# Patient Record
Sex: Male | Born: 2000 | Race: Black or African American | Hispanic: No | Marital: Single | Smoking: Never smoker
Health system: Southern US, Community
[De-identification: ages and names within clinical notes are randomized; demographics above are authoritative.]

---
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
## 2022-11-12 IMAGING — CT CT ANGIO HEAD
2 of 11 series · 8 of 47 positions shown · IV contrast (Omni 300)
Comparison: None.

CLINICAL DATA: Neuro deficit, acute, stroke suspected. Right eye
ptosis.

EXAM:
CT ANGIOGRAPHY HEAD
TECHNIQUE: Multidetector CT imaging of the head was performed using the
standard protocol during bolus administration of intravenous
contrast. Multiplanar CT image reconstructions and MIPs were
obtained to evaluate the vascular anatomy.
CONTRAST:  75mL OMNIPAQUE IOHEXOL 350 MG/ML SOLN

[Series 9: head bone · axial · 0.49mm/px · z∈[-94,+10]mm · 4 of 88 slices shown]
[im 18/88  bone]
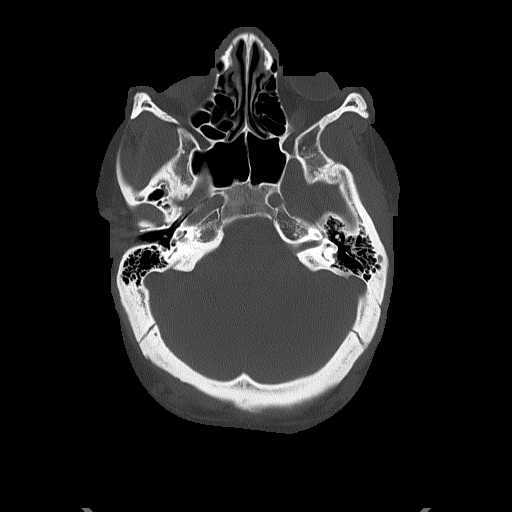
[im 35/88  bone]
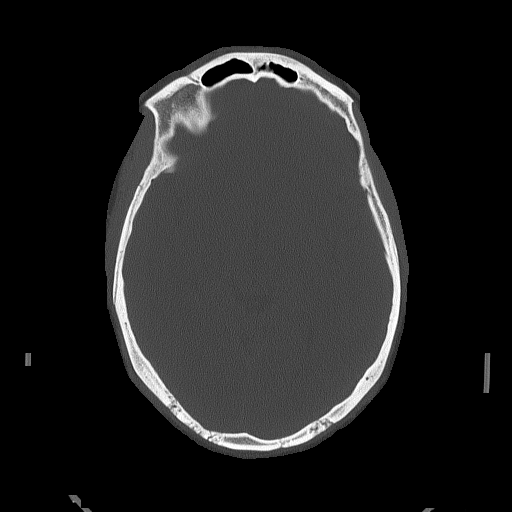
[im 53/88  bone]
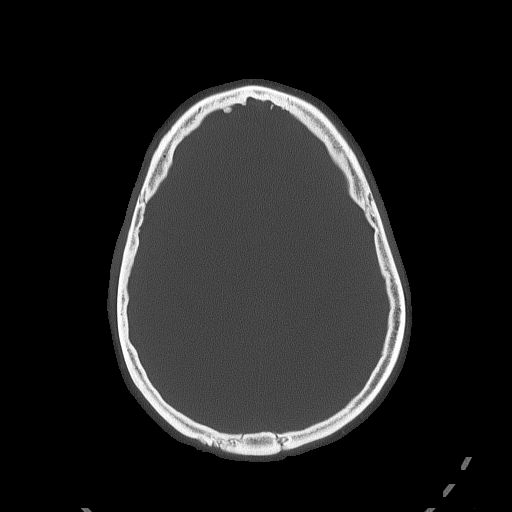
[im 70/88  bone]
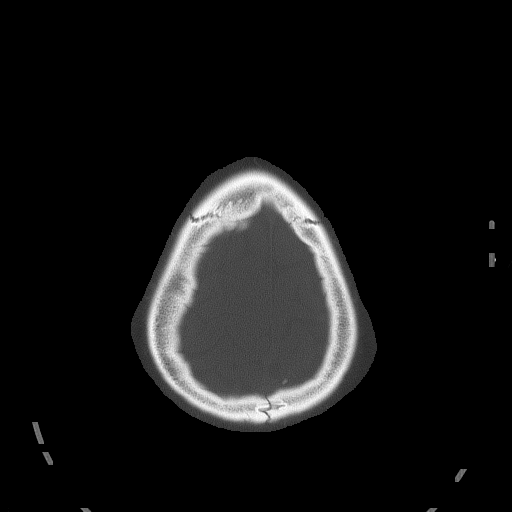

[Series 12: cow 2.0 · axial · 0.50mm/px · z∈[-132,-22]mm · 4 of 93 slices shown]
[im 19/93  brain]
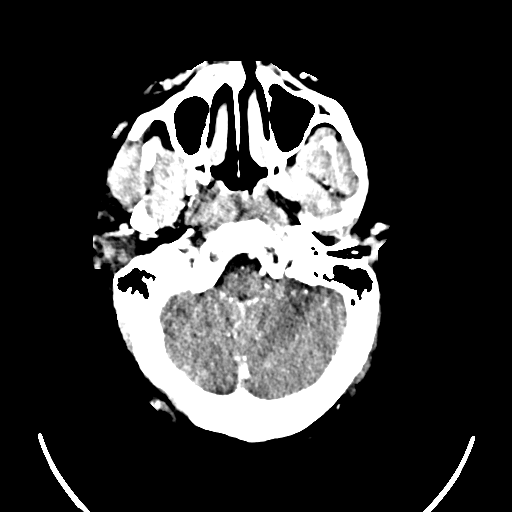
[im 37/93  bone]
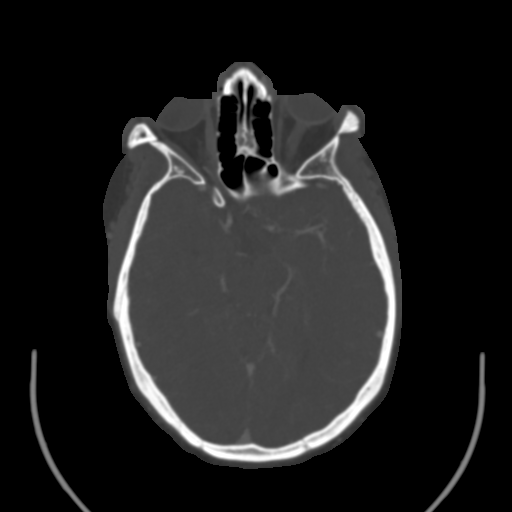
[im 56/93  brain]
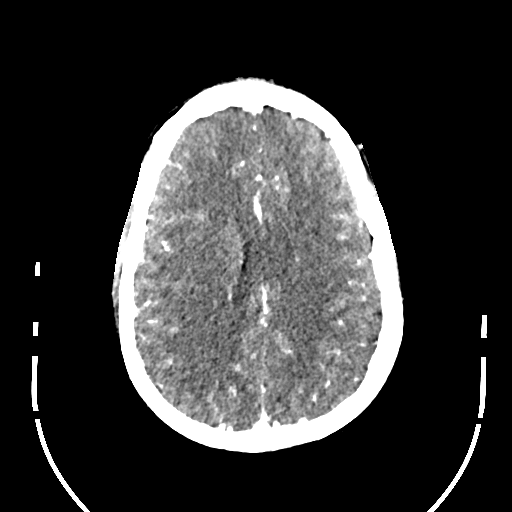
[im 74/93  bone]
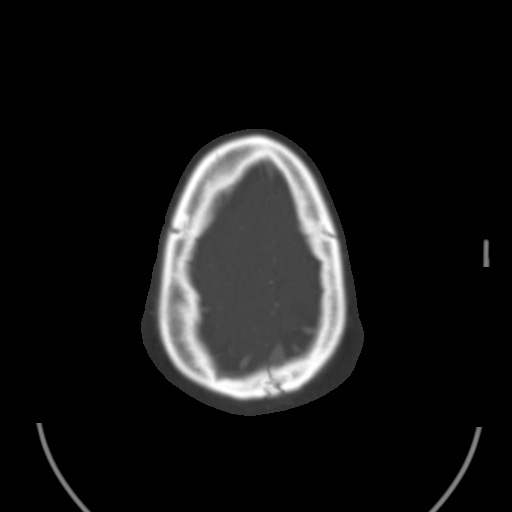

[8 of 47 positions shown; findings below may reference images not displayed]

FINDINGS: CT HEAD

Brain: No evidence of acute infarction, hemorrhage, hydrocephalus,
extra-axial collection or mass lesion/mass effect.

Vascular: No hyperdense vessel or unexpected calcification.

Skull: Normal. Negative for fracture or focal lesion.

Sinuses: Imaged portions are clear. Soft tissue density within the
bilateral external auditory canals, may represent cerumen.

Orbits: No acute finding.

CTA HEAD

Anterior circulation: No significant stenosis, proximal occlusion,
aneurysm, or vascular malformation.

Posterior circulation: No significant stenosis, proximal occlusion,
aneurysm, or vascular malformation.

Venous sinuses: As permitted by contrast timing, patent.

Anatomic variants: Right A1/ACA fenestration.
IMPRESSION: 1. Normal CT appearance of the brain. No evidence of acute infarct
or intracranial hemorrhage.
2. Normal CTA of the head. No large vessel occlusion, significant
stenosis, or aneurysm.
3. If clinical concern for carotid dissection with Horner syndrome,
consider CTA or MRA of the neck.
